# Patient Record
Sex: Female | Born: 1950 | Race: Black or African American | Hispanic: No | Smoking: Never smoker
Health system: Southern US, Community
[De-identification: ages and names within clinical notes are randomized; demographics above are authoritative.]

## PROBLEM LIST (undated history)

## (undated) ENCOUNTER — Emergency Department (HOSPITAL_COMMUNITY): Payer: Medicare PPO | Source: Home / Self Care

## (undated) HISTORY — PX: ABDOMINAL HYSTERECTOMY: SHX81

## (undated) HISTORY — PX: BREAST BIOPSY: SHX20

---

## 1998-12-19 ENCOUNTER — Ambulatory Visit (HOSPITAL_COMMUNITY): Admission: RE | Admit: 1998-12-19 | Discharge: 1998-12-19 | Payer: Self-pay | Admitting: *Deleted

## 1999-05-15 ENCOUNTER — Encounter: Admission: RE | Admit: 1999-05-15 | Discharge: 1999-08-13 | Payer: Self-pay | Admitting: *Deleted

## 2000-10-07 ENCOUNTER — Ambulatory Visit (HOSPITAL_COMMUNITY): Admission: RE | Admit: 2000-10-07 | Discharge: 2000-10-07 | Payer: Self-pay | Admitting: Gastroenterology

## 2000-10-07 ENCOUNTER — Encounter (INDEPENDENT_AMBULATORY_CARE_PROVIDER_SITE_OTHER): Payer: Self-pay | Admitting: Specialist

## 2002-11-09 ENCOUNTER — Other Ambulatory Visit: Admission: RE | Admit: 2002-11-09 | Discharge: 2002-11-09 | Payer: Self-pay | Admitting: Family Medicine

## 2002-11-22 ENCOUNTER — Ambulatory Visit (HOSPITAL_COMMUNITY): Admission: RE | Admit: 2002-11-22 | Discharge: 2002-11-22 | Payer: Self-pay | Admitting: Family Medicine

## 2002-11-22 ENCOUNTER — Encounter: Payer: Self-pay | Admitting: Family Medicine

## 2002-12-29 ENCOUNTER — Ambulatory Visit (HOSPITAL_COMMUNITY): Admission: RE | Admit: 2002-12-29 | Discharge: 2002-12-29 | Payer: Self-pay | Admitting: Urology

## 2002-12-29 ENCOUNTER — Encounter: Payer: Self-pay | Admitting: Obstetrics and Gynecology

## 2005-03-29 ENCOUNTER — Other Ambulatory Visit: Admission: RE | Admit: 2005-03-29 | Discharge: 2005-03-29 | Payer: Self-pay | Admitting: Family Medicine

## 2006-02-05 ENCOUNTER — Encounter (INDEPENDENT_AMBULATORY_CARE_PROVIDER_SITE_OTHER): Payer: Self-pay | Admitting: Specialist

## 2006-02-05 ENCOUNTER — Ambulatory Visit (HOSPITAL_COMMUNITY): Admission: RE | Admit: 2006-02-05 | Discharge: 2006-02-06 | Payer: Self-pay | Admitting: Obstetrics and Gynecology

## 2007-04-13 ENCOUNTER — Encounter: Admission: RE | Admit: 2007-04-13 | Discharge: 2007-04-13 | Payer: Self-pay | Admitting: Family Medicine

## 2008-06-20 ENCOUNTER — Encounter: Admission: RE | Admit: 2008-06-20 | Discharge: 2008-06-20 | Payer: Self-pay | Admitting: Internal Medicine

## 2010-08-24 NOTE — Procedures (Signed)
[facility]  Patient:    [name], [name]                     MRN: [id number] Proc. Date: 10/07/00 Adm. Date:  [id number] Attending:  Orland Mustard CC:         Talmadge Coventry, M.D.                           Procedure Report  PROCEDURE PERFORMED:  Esophagogastroduodenoscopy and biopsy.  ENDOSCOPIST:  Llana Aliment. Edwards, M.D.  MEDICATIONS:  Hurricaine spray, fentanyl 75 mcg, and Versed 7.5 mg IV.  INDICATIONS:  Persistent iron deficiency anemia.  DESCRIPTION OF PROCEDURE:  The procedure has been explained to the patient and consent obtained.  With the patient in the left lateral decubitus position, the Olympus video endoscope was inserted blindly into the esophagus and advanced under direct visualization.  The duodenum was completely normal endoscopically.  We advanced way down into the second and third portion of the duodenum, and obtained several biopsies to rule out celiac disease.  The scope was withdrawn back into the stomach.  There was moderate antral gastritis with several erosions.  A biopsy was taken for rapid urease test for Helicobacter. The fundus and cardia were seen well and the retroview was normal.  The distal and proximal esophagus were endoscopically normal.  The scope was withdrawn and the patient tolerated the procedure.  She was maintained on low flow oxygen and pulse oximeter throughout the procedure with no obvious problem.  ASSESSMENT: 1. Antral gastritis. 2. No other obvious cause for the patients iron deficiency anemia.  PLAN: 1. We will go ahead and check test for Helicobacter and    celiac disease. 2. Begin Aciphex. 3. Will see her back in the office in two months. DD:  10/07/00 TD:  10/07/00 Job: [id number]

## 2010-08-24 NOTE — Op Note (Signed)
[name], [name]              ACCOUNT NO.:  [id number]   MEDICAL RECORD NO.:  [id number]          PATIENT TYPE:  AMB   LOCATION:  SDC                           FACILITY:  [facility]   PHYSICIAN:  Richardean Sale, M.D.   DATE OF BIRTH:  March 08, 1951   DATE OF PROCEDURE:  02/05/2006  DATE OF DISCHARGE:                                 OPERATIVE REPORT   PREOPERATIVE DIAGNOSIS:  Symptomatic uterine fibroids.   POSTOPERATIVE DIAGNOSIS:  Symptomatic uterine fibroids.   PROCEDURE:  Laparoscopic assisted vaginal hysterectomy with bilateral  salpingo-oophorectomy.   SURGEON:  Richardean Sale, M.D.   ASSISTANT:  Cordelia Pen A. Rosalio Macadamia, M.D.   ANESTHESIA:  General.   COMPLICATIONS:  None.   ESTIMATED BLOOD LOSS:  200 mL.   FINDINGS:  14 weeks' size fibroid uterus with multiple intramural fibroid,  normal appearing tubes and ovaries bilaterally, normal appearing cervix.   SPECIMEN:  Uterus, cervix, tubes and ovaries sent to pathology.   INDICATIONS:  This is a 60 year old African American female gravida 2, para  2, who presents today for definitive management of symptomatic uterine  fibroids.  The patient has experienced increasing lower abdominal pressure  and pain secondary to her fibroids.  Ultrasound showed multiple uterine  fibroids and an approximately 14- weeks size uterus.  The patient presents  today for definitive management with hysterectomy. Prior to procedure we  discussed the risks, benefits and alternatives of surgery and informed was  obtained.  We discussed the risks which include but are not limited to  hemorrhage requiring transfusion, infection, injury to the bowel, bladder,  ureters or other organs which could require additional surgery, we reviewed  risks of DVT, pulmonary embolus, anesthesia related complications.  The  patient voiced understanding of all the above and desires to proceed.  Informed consent was obtained and all questions were answered before  proceeding  to the OR.  The plan is to attempt a laparoscopic hysterectomy,  possible total laparoscopic hysterectomy, or an LAVH if feasible based on  findings at the time of laparoscopy.   DESCRIPTION OF PROCEDURE:  The patient was taken to the operating room where  she was given a general anesthetic.  She was then prepped and draped in the  usual sterile fashion with Betadine and a Foley catheter was placed.  A  speculum was then placed in the vagina and the Rumi uterine manipulator was  placed after the uterus was sounded to 9 cm.  Once this was secure, the  speculum was removed and attention was then turned to the patient's abdomen.  4 mL of 0.25% plain Marcaine were injected in the infraumbilical area and a  10 mm skin incision was made through the patient's prior tubal ligation  scar.  This was carried down sharply to the fascia.  The fascia was then  grasped using two Kocher clamps and the fascia was then entered sharply.  The fascial incision was then secured with a pursestring Vicryl suture and  the peritoneum was then identified and a finger was placed into the  incision, there were no obvious adhesions.  The [facility] trocar  and sleeve  were then introduced and intra-abdominal placement was confirmed with the  laparoscope.  CO2 gas was allowed to insufflate. Two additional ports were  then made in both lower quadrants, these were 5-mm ports.  Again, by first  injecting Marcaine and then making an incision.  Each port was introduced  under direct visualization without complication.   Inspection of the pelvis revealed findings as noted.  Both adnexa, ovaries  and tubes appeared normal. Liver and gallbladder normal.  Appendix appeared  normal. Anterior and posterior cul-de-sacs were clear. The uterus was  approximately 12-14 weeks size with multiple uterine fibroids.  There was an  approximately 3 cm fibroid on the left lower uterine segment. Given that the  uterus was mobile and there was  adequate lateral mobility and both cul-de-  sacs were clear, the decision was made to proceed with laparoscopic  approach. Both ureters were visualized and peristalsing normally.  They were  well beneath the infundibulopelvic ligaments.  At this point, the round  ligaments on both sides were grasped with the gyrus, cauterized, transected,  and were hemostatic.  The right infundibulopelvic ligament was grasped,  cauterized, transected, and was hemostatic, as well.  The broad ligament was  then dissected and the anterior and posterior leaves were grasped with the  gyrus, cauterized, and transected.  The bladder flap was created using the  endoshears carrying the anterior broad ligament incision across to the mid  portion of the lower uterine segment.  Attention was then turned to the left  side. There were adhesions involving the colon to the sidewall and the  infundibulopelvic ligament.  Therefore, the decision was made to proceed  with ligation of the utero-ovarian ligaments and after completion of the  hysterectomy, to return and remove the left ovary and tube as visualization  and access would be much better.  The utero-ovarian ligament was cauterized  with the gyrus, was transected, and was hemostatic.  The broad ligament was  then separated from the anterior and posterior leaves and was serial  cauterized and transected, as well.  The endoshears were then used to  complete formation of the bladder flap.  The bladder was then very gently  dissected off the lower uterine segment and the cervix without difficulty.  On the left lower uterine segment, there was a fibroid was present that made  it difficult to visualize the uterine vessels and I did not believe I could  safely ligate those laparoscopically.  Therefore, given that the bladder was  adequately displaced, the decision was made to convert to a laparoscopic- assisted vaginal hysterectomy.   At this point, attention was then turned  to the vaginal portion of the case  where the [facility] manipulator was removed.  A posterior weighted speculum was  then placed in the vagina and the cervix was grasped with two Christella Hartigan  tenaculum.  20 mL of dilute vasopressin were then injected around the cervix  and a circumferential incision was made with the Bovie around the cervix.  The bladder was then carefully dissected off the cervix and the anterior cul-  de-sac was then entered with Metzenbaum scissors. The posterior cul-de-sac  was entered without difficulty.  The uterosacral ligaments on both sides  were then clamped, transected and suture ligated with Vicryl suture and were  hemostatic. At this point, the LigaSure was then used to secure the  remaining portions of the cardinal ligaments. The uterine vessels on the  left side were able to be  identified.  These were cauterized with a LigaSure  and were transected with good hemostasis.  The same procedure was then  performed on the right side. Any additional attachments were cauterized with  the LigaSure and then transected. At this point, a finger was then placed  into the vaginal incision and the uterus was then freed from all  attachments.  Given the size of the uterus, it was unable to be removed en  toto, therefore, the uterus was bivalved and multiple fibroids were then  cored out in order to the decrease the size of the uterus and once this  occurred, we were able to remove the uterus.  All specimens were then sent  together to pathology.  Estimated weight was 320 grams in the OR.  The right  ovary and tube were removed with the uterine specimen.   At this point, the vaginal cuff was carefully inspected.  Any areas of  bleeding were secured with figure-of-eight Vicryl sutures and the anterior  and posterior vaginal cuff and peritoneum were made hemostatic with a  running locked Vicryl suture. A sponge stick was used to keep the bowel out  of the incision. Once the vaginal cuff  was hemostatic, the vagina was then  closed using interrupted figure-of-eight suture.  A McCall's culdoplasty  suture was placed to prevent enterocele formation and the uterosacral  ligaments were then plicated together in the midline for cuff support. Once  the cuff was hemostatic, the vagina was then packed with plain gauze soaked  in Estrace cream. The Foley catheter was left in place.   Sterile gown and gloves were then changed and attention was then turned back  to the abdomen.  The three ports had remained in place, the CO2 gas was  allowed to insufflate, and the camera was then reinserted in the umbilical  port.  The vaginal cuff was inspected.  There was some small oozing coming  from the peritoneum.  This was cauterized and hemostatic.  The Nezhat was  used to irrigate the pelvis.  The left infundibular pelvic ligament was identified.  There were some very thin adhesions of the colon attached to  the infundibulopelvic ligament and in the sidewall.  These were very  carefully transected with the endoshears and were hemostatic. At this point,  the infundibulopelvic ligament was then able to be safely grasped with the  gyrus, cauterized, transected, and was hemostatic.  The left tube and ovary  were then detached.  A grasper then was then placed into the operative  laparoscopic and the ovary and tube were removed through the 10 mm umbilical  port and sent to pathology labeled as left ovary and tube. At this point,  the camera was reintroduced and all pedicles and the vaginal cuff were  carefully inspected.  There was no bleeding noted.  The Nezhat was used to  irrigate and suction.  Once hemostasis was confirmed, the procedure was then  terminated. All instruments were removed from the patient's abdomen.  There  was no bleeding coming from the port sites.  The CO2 gas was allowed to  escape. The 10 mm fascial incision was closed by tying the already placed  pursestring suture with  successful closure of the fascia.  The umbilical  incision was closed with a running 4-0 Vicryl suture in a subcuticular  fashion and the 5 mm port were closed with Dermabond.   The patient tolerated the procedure very well.  All sponge, lap, needle and  instrument counts were correct x2.  The patient was taken to the recovery  room awake and in stable condition and there were no complications.      Richardean Sale, M.D.  Electronically Signed     JW/MEDQ  D:  02/05/2006  T:  02/05/2006  Job:  [id number]

## 2010-08-24 NOTE — Discharge Summary (Signed)
[name], [name]              ACCOUNT NO.:  [id number]   MEDICAL RECORD NO.:  [id number]          PATIENT TYPE:  INP   LOCATION:  9308                          FACILITY:  [facility]   PHYSICIAN:  Richardean Sale, M.D.   DATE OF BIRTH:  Jan 24, 1951   DATE OF ADMISSION:  02/05/2006  DATE OF DISCHARGE:  02/06/2006                               DISCHARGE SUMMARY   ADMITTING DIAGNOSIS:  Symptomatic uterine fibroids.   DISCHARGE DIAGNOSES:  1. Symptomatic uterine fibroids.  2. Status post laparoscopic-assisted vaginal hysterectomy with      bilateral salpingo-oophorectomy.   OPERATIVE FINDINGS:  A 14-week size fibroid uterus with multiple  intramural fibroids, normal-appearing fallopian tubes and ovaries and  cervix.   HOSPITAL COURSE/HISTORY OF PRESENT ILLNESS:  Please see admission  History and Physical for details. Briefly, this is a 60 year old African-  American female, gravida 2, para 2, who presented for definitive  management of uterine fibroids. She underwent a laparoscopic-assisted  vaginal hysterectomy with bilateral salpingo-oophorectomy on 02/05/2006.  Medical history is significant for diabetes. The patient's blood sugars  were controlled with a Glucomander during her hospitalization until she  was able to tolerate a regular diet and restart her home medications.  Postoperatively, she remained afebrile. On postop day #1, she was  ambulating well, was voiding without difficulty, and was tolerating a  regular diet and pain was controlled with oral pain medication. She was  subsequently discharged to home on postop day #1 in improved condition.   LABORATORY STUDIES:  Postop day #1, hemoglobin 11, hematocrit 32.8,  platelets 228,000, white count 8.9. Pathology: Benign-appearing cervix,  uterus, ovaries, and fallopian tubes, small paratubal cyst, benign  endometrial polyp and multiple leiomyoma.   DISPOSITION:  To home.   CONDITION:  Improved.   FOLLOWUP:  1. The patient  will follow up in 4 weeks for routine postop visit.  2. She will also follow up with Dr. Smith Mince, her primary care      physician, within the next 2 weeks.   MEDICATIONS:  1. Percocet 1-2 tablets p.o. q.4-6 h. p.r.n. pain.  2. Motrin 600 mg p.o. q.6 h. p.r.n. pain.  3. The patient is to resume her home medications.   INSTRUCTIONS:  She is to call for fever greater than 100.5, heavy  vaginal bleeding, or pain not relieved with pain medication, or any  other questions.      Richardean Sale, M.D.  Electronically Signed     JW/MEDQ  D:  02/25/2006  T:  02/25/2006  Job:  [id number]

## 2014-07-06 ENCOUNTER — Encounter: Payer: Self-pay | Admitting: Dietician

## 2014-07-06 ENCOUNTER — Encounter: Payer: Medicare PPO | Attending: Internal Medicine | Admitting: Dietician

## 2014-07-06 VITALS — Ht 68.0 in | Wt 231.6 lb

## 2014-07-06 DIAGNOSIS — Z713 Dietary counseling and surveillance: Secondary | ICD-10-CM | POA: Insufficient documentation

## 2014-07-06 DIAGNOSIS — Z794 Long term (current) use of insulin: Secondary | ICD-10-CM | POA: Diagnosis not present

## 2014-07-06 DIAGNOSIS — E119 Type 2 diabetes mellitus without complications: Secondary | ICD-10-CM | POA: Diagnosis not present

## 2014-07-06 NOTE — Progress Notes (Signed)
  Medical Nutrition Therapy:  Appt start time: 1515 end time:  1615.   Assessment:  Primary concerns today: [name] is here today since she would like help with a food plan for diabetes and would like to lose some weight (around 50 lbs). Has had diabetes for about 30 years and has seen a dietitian abotu 15 years ago. Tests blood sugar in the morning and 114-166 mg/dl. Recently was taking Metformin and Lantus and is now taking Toujeo and Kombliglyze about a month ago. Numbers went up in the past month but they are coming back down. Has been drinking regular Coke lately. Most recent Hgb A1c was 10.1% on 06/08/2014.  Had some blood sugar readings over 200 mg/dl earlier in the month but it is coming back down. Has not had any recent low blood sugars.  Has trouble chewing some foods because she is missing some teeth.  [name] is working part time 10-2 every day and lives by herself, though his son and family might be moving in. Might skip lunch.   Preferred Learning Style:   No preference indicated   Learning Readiness:   Ready  MEDICATIONS: see list   DIETARY INTAKE:  Usual eating pattern includes 2-3 meals and 1-2 snacks per day.  Avoided foods include: steak, foods that are hard to chew, asparagus   24-hr recall:  B ( AM): coffee with splenda and cream with egg and sausage McMuffin Snk ( AM): applesauce L ( PM): crackers with peanut butter Snk ( PM): none D ( PM): picks up burger king chicken sandwich or whopper Or taco bell taco salad or [employer] Snk ( PM): peanut butter sandwiches, frosted mini wheats/cereal, pecans Beverages: 1 small Coke, juice, lactaid milk, water  Usual physical activity: no regular activity   Estimated energy needs: 1600 calories 180 g carbohydrates 120 g protein 44 g fat  Progress Towards Goal(s):  In progress.   Nutritional Diagnosis:  [place]-2.1 Inpaired nutrition utilization As related to glucose metabolism.  As evidenced by Hgb A1c of 10.1%.     Intervention:  Nutrition counseling provided. Goals:  Follow Diabetes Meal Plan as instructed  Eat 3 meals and 2 snacks, every 3-5 hrs  Limit carbohydrate intake to 30-45 grams carbohydrate/meal  Limit carbohydrate intake to 0-15 grams carbohydrate/snack  Add lean protein foods to meals/snacks  Monitor glucose levels as instructed by your doctor  Aim for 30 mins of physical activity daily. Look into joining gym or walking with a goal 30 minutes 3 x week.   Try using plain non fat [place] Yogurt instead of sour cream  Plan to prepare dinner 2 x week  For dinner meals: have salad with tuna, make a tacos salad with refried beans, or rotisserie chicken, or spinach with a boiled egg  Think about add some vegetables on the side to fast food meals at home  Teaching Method Utilized:  Visual Auditory Hands on  Handouts given during visit include:  Living Well With Diabetes  Meal card  Blood Glucose monitoring  15 g CHO Snacks  Diabetes Medication  Barriers to learning/adherence to lifestyle change: none  Demonstrated degree of understanding via:  Teach Back   Monitoring/Evaluation:  Dietary intake, exercise, and body weight in 2 month(s).

## 2014-07-06 NOTE — Patient Instructions (Addendum)
Goals:  Follow Diabetes Meal Plan as instructed  Eat 3 meals and 2 snacks, every 3-5 hrs  Limit carbohydrate intake to 30-45 grams carbohydrate/meal  Limit carbohydrate intake to 0-15 grams carbohydrate/snack  Add lean protein foods to meals/snacks  Monitor glucose levels as instructed by your doctor  Aim for 30 mins of physical activity daily. Look into joining gym or walking with a goal 30 minutes 3 x week.   Try using plain non fat [place] Yogurt instead of sour cream  Plan to prepare dinner 2 x week  For dinner meals: have salad with tuna, make a tacos salad with refried beans, or rotisserie chicken, or spinach with a boiled egg  Think about add some vegetables on the side to fast food meals at home

## 2014-09-07 ENCOUNTER — Ambulatory Visit: Payer: Medicare PPO | Admitting: Dietician

## 2015-03-30 ENCOUNTER — Other Ambulatory Visit: Payer: Self-pay | Admitting: Internal Medicine

## 2015-03-30 DIAGNOSIS — Z1231 Encounter for screening mammogram for malignant neoplasm of breast: Secondary | ICD-10-CM

## 2015-03-30 DIAGNOSIS — E2839 Other primary ovarian failure: Secondary | ICD-10-CM

## 2015-05-04 ENCOUNTER — Ambulatory Visit
Admission: RE | Admit: 2015-05-04 | Discharge: 2015-05-04 | Disposition: A | Discharge status: Home / Self Care | Payer: Medicare PPO | Source: Ambulatory Visit | Attending: Internal Medicine | Admitting: Internal Medicine

## 2015-05-04 DIAGNOSIS — E2839 Other primary ovarian failure: Secondary | ICD-10-CM

## 2015-05-04 DIAGNOSIS — Z1231 Encounter for screening mammogram for malignant neoplasm of breast: Secondary | ICD-10-CM

## 2016-08-20 ENCOUNTER — Ambulatory Visit (INDEPENDENT_AMBULATORY_CARE_PROVIDER_SITE_OTHER): Payer: Medicare Other

## 2016-08-20 ENCOUNTER — Ambulatory Visit (HOSPITAL_COMMUNITY)
Admission: EM | Admit: 2016-08-20 | Discharge: 2016-08-20 | Disposition: A | Discharge status: Home / Self Care | Payer: Medicare Other | Attending: Internal Medicine | Admitting: Internal Medicine

## 2016-08-20 ENCOUNTER — Encounter (HOSPITAL_COMMUNITY): Payer: Self-pay | Admitting: Family Medicine

## 2016-08-20 DIAGNOSIS — M25562 Pain in left knee: Secondary | ICD-10-CM | POA: Diagnosis not present

## 2016-08-20 DIAGNOSIS — W19XXXA Unspecified fall, initial encounter: Secondary | ICD-10-CM

## 2016-08-20 DIAGNOSIS — S80212A Abrasion, left knee, initial encounter: Secondary | ICD-10-CM | POA: Diagnosis not present

## 2016-08-20 NOTE — ED Triage Notes (Signed)
Pt here for left knee pain after a fall. sts she tripped and fell over something taped in the floor,

## 2016-08-20 NOTE — Discharge Instructions (Addendum)
Your xrays were negative for fracture, effusion, or dislocation. Ace wrap for comfort, rest,ice,elevate. Follow up with Orthopedics, Aundria Rudogers, in 1 week if symptoms not improving. Return to UC as needed. May take 400mg   of ibuprofen every 8 hours for 3 days for pain/inflammation. Daily wound care.

## 2016-08-20 NOTE — ED Provider Notes (Signed)
CSN: [id number]     Arrival date & time 08/20/16  1338 History   None    Chief Complaint  Patient presents with  . Knee Pain   (Consider location/radiation/quality/duration/timing/severity/associated sxs/prior Treatment) The history is provided by the patient. No language interpreter was used.  Knee Pain  Location:  Knee Injury: yes   Mechanism of injury: fall   Mechanism of injury comment:  Over tapped [place] on [place], no LOC, did not strike head Fall:    Fall occurred:  Tripped and walking   Height of fall:  Stance   Impact surface:  Primary school teacher of impact:  Knees   Entrapped after fall: no   Knee location:  L knee Pain details:    Quality:  Aching   Radiates to:  Does not radiate   Severity:  Mild   Onset quality:  Sudden   Timing:  Constant   Progression:  Improving Chronicity:  New Dislocation: no   Foreign body present:  No foreign bodies Tetanus status:  Up to date Prior injury to area:  No Relieved by:  Rest and NSAIDs Worsened by:  Nothing Associated symptoms: swelling   Associated symptoms: no back pain, no decreased ROM and no fever     History reviewed. No pertinent past medical history. Past Surgical History:  Procedure Laterality Date  . ABDOMINAL HYSTERECTOMY     History reviewed. No pertinent family history. Social History  Substance Use Topics  . Smoking status: Never Smoker  . Smokeless tobacco: Never Used  . Alcohol use Not on file   OB History    No data available     Review of Systems  Constitutional: Negative for activity change and fever.  HENT: Negative.   Eyes: Negative.   Respiratory: Negative for shortness of breath.   Cardiovascular: Negative for chest pain.  Gastrointestinal: Negative for abdominal pain.  Endocrine: Negative.   Genitourinary: Negative.   Musculoskeletal: Positive for joint swelling. Negative for back pain and gait problem.  Skin: Positive for wound. Negative for color change.  Neurological:  Negative for headaches.  Hematological: Negative.   Psychiatric/Behavioral: Negative.   All other systems reviewed and are negative.   Allergies  Patient has no known allergies.  Home Medications   Prior to Admission medications   Medication Sig Start Date End Date Taking? Authorizing Provider  aspirin 81 MG tablet Take 81 mg by mouth daily.    [provider]  Cholecalciferol (VITAMIN D-3 PO) Take by mouth.    [provider]  Coenzyme Q10 200 MG capsule Take 200 mg by mouth daily.    [provider]  hydrochlorothiazide (HYDRODIURIL) 25 MG tablet Take 25 mg by mouth daily.    [provider]  Insulin Glargine (TOUJEO SOLOSTAR [place]) Inject 44 Units into the skin.    [provider]  Multiple Vitamins-Minerals (MULTIVITAMIN WITH MINERALS) tablet Take 1 tablet by mouth daily.    [provider]  niacin 50 MG tablet Take 50 mg by mouth at bedtime.    [provider]  ramipril (ALTACE) 5 MG capsule Take 5 mg by mouth daily.    [provider]  Saxagliptin-Metformin (KOMBIGLYZE XR PO) Take by mouth.    [provider]  simvastatin (ZOCOR) 40 MG tablet Take 40 mg by mouth daily.    [provider]   Meds Ordered and Administered this Visit  Medications - No data to display  BP (!) 138/58   Pulse  89   Temp 98 F (36.7 C)   Resp 16   SpO2 100%  No data found.   Physical Exam  Constitutional: She is oriented to person, place, and time. She appears well-developed and well-nourished. She is active.  Non-toxic appearance. She does not have a sickly appearance. She does not appear ill. No distress.  HENT:  Head: Normocephalic.  Right Ear: Tympanic membrane normal.  Left Ear: Tympanic membrane normal.  Nose: Nose normal.  Mouth/Throat: Uvula is midline and mucous membranes are normal.  Eyes: Pupils are equal, round, and reactive to light.  Neck: Trachea normal and normal range of motion. Muscular  tenderness present. No Brudzinski's sign and no Kernig's sign noted.  Cardiovascular: Normal rate and regular rhythm.   Pulmonary/Chest: Effort normal and breath sounds normal.  Musculoskeletal:       Right shoulder: She exhibits normal range of motion, no tenderness, no bony tenderness, no swelling, no effusion, no crepitus, no deformity, no laceration, normal pulse and normal strength.  Neurological: She is alert and oriented to person, place, and time. She has normal strength. No cranial nerve deficit or sensory deficit. Gait normal. GCS eye subscore is 4. GCS verbal subscore is 5. GCS motor subscore is 6.  Skin: Skin is warm and dry. Abrasion noted. No rash noted.     Psychiatric: She has a normal mood and affect. Her speech is normal and behavior is normal.  Nursing note and vitals reviewed.   Urgent Care Course     Procedures (including critical care time)  Labs Review Labs Reviewed - No data to display  Imaging Review Dg Knee Complete 4 Views Left  Result Date: 08/20/2016 CLINICAL DATA:  Fall several days ago with knee pain, initial encounter EXAM: LEFT KNEE - COMPLETE 4+ VIEW COMPARISON:  None. FINDINGS: Mild medial joint space narrowing is noted. No sizable joint effusion is seen. No acute fracture or dislocation is noted. IMPRESSION: Mild degenerative change without acute abnormality. Electronically Signed   By: Alcide CleverMark  Lukens M.D.   On: 08/20/2016 14:53       MDM   1. Acute pain of left knee   2. Fall, initial encounter   3. Abrasion of left knee, initial encounter     1605: Your xrays were negative for fracture, effusion, or dislocation. Ace wrap applied in UC  for comfort, rest,ice,elevate. Follow up with Orthopedics, Aundria Rudogers, in 1 week if symptoms not improving. Return to UC as needed. May take 400mg   of ibuprofen every 8 hours for 3 days for pain/inflammation. Daily wound care. Pt verbalized understanding to this provider.     Clancy Gourdefelice, Dovber Ernest, NP 08/20/16  [phone]

## 2016-12-28 ENCOUNTER — Emergency Department (HOSPITAL_COMMUNITY)
Admission: EM | Admit: 2016-12-28 | Discharge: 2016-12-28 | Disposition: A | Discharge status: Home / Self Care | Payer: Medicare Other | Attending: Emergency Medicine | Admitting: Emergency Medicine

## 2016-12-28 DIAGNOSIS — Z79899 Other long term (current) drug therapy: Secondary | ICD-10-CM | POA: Insufficient documentation

## 2016-12-28 DIAGNOSIS — R55 Syncope and collapse: Secondary | ICD-10-CM | POA: Diagnosis present

## 2016-12-28 DIAGNOSIS — Z7982 Long term (current) use of aspirin: Secondary | ICD-10-CM | POA: Insufficient documentation

## 2016-12-28 LAB — CBC
HCT: 35.7 % — ABNORMAL LOW (ref 36.0–46.0)
HEMOGLOBIN: 11.2 g/dL — AB (ref 12.0–15.0)
MCH: 26.8 pg (ref 26.0–34.0)
MCHC: 31.4 g/dL (ref 30.0–36.0)
MCV: 85.4 fL (ref 78.0–100.0)
Platelets: 269 10*3/uL (ref 150–400)
RBC: 4.18 MIL/uL (ref 3.87–5.11)
RDW: 13.5 % (ref 11.5–15.5)
WBC: 8.5 10*3/uL (ref 4.0–10.5)

## 2016-12-28 LAB — BASIC METABOLIC PANEL
Anion gap: 10 (ref 5–15)
BUN: 17 mg/dL (ref 6–20)
CALCIUM: 9.7 mg/dL (ref 8.9–10.3)
CHLORIDE: 103 mmol/L (ref 101–111)
CO2: 24 mmol/L (ref 22–32)
CREATININE: 1.56 mg/dL — AB (ref 0.44–1.00)
GFR calc non Af Amer: 33 mL/min — ABNORMAL LOW (ref >60)
GFR, EST AFRICAN AMERICAN: 39 mL/min — AB (ref >60)
Glucose, Bld: 301 mg/dL — ABNORMAL HIGH (ref 65–99)
Potassium: 5.1 mmol/L (ref 3.5–5.1)
SODIUM: 137 mmol/L (ref 135–145)

## 2016-12-28 MED ORDER — SODIUM CHLORIDE 0.9 % IV BOLUS (SEPSIS)
500.0000 mL | Freq: Once | INTRAVENOUS | Status: AC
  Administered 2016-12-28: 500 mL via INTRAVENOUS

## 2016-12-28 MED ORDER — SODIUM CHLORIDE 0.9 % IV SOLN: INTRAVENOUS | Status: DC

## 2016-12-28 NOTE — Discharge Instructions (Signed)
Follow-up with your doctor. Return for any new or worse symptoms. Return for any passing out. Today's workup without any significant findings.

## 2016-12-28 NOTE — ED Provider Notes (Signed)
[facility]-EMERGENCY DEPT Provider Note   CSN: [id number] Arrival date & time: 12/28/16  1858     History   Chief Complaint Chief Complaint  Patient presents with  . Near Syncope    HPI [name] is a 66 y.o. female.  The patient was walking into the view a college football game. And it was around 6 PM and she started to feel lightheaded and feels if she was going to pass out she sat down. She did not pass out. Similar symptoms happened about a week ago she thinks is due to the heat. She felt hot. Patient does not have a history of syncope. Has no complaints currently. Patient stated she started to feel lightheaded and dizzy. No chest pain no headache no shortness of breath no abdominal pain no nausea or vomiting. No focal weakness or speech problems.      No past medical history on file.  Patient Active Problem List   Diagnosis Date Noted  . Acute pain of left knee 08/20/2016  . Fall 08/20/2016    Past Surgical History:  Procedure Laterality Date  . ABDOMINAL HYSTERECTOMY      OB History    No data available       Home Medications    Prior to Admission medications   Medication Sig Start Date End Date Taking? Authorizing Provider  aspirin 81 MG tablet Take 81 mg by mouth daily.   Yes [provider]  Cholecalciferol (VITAMIN D-3 PO) Take by mouth.   Yes [provider]  Coenzyme Q10 200 MG capsule Take 200 mg by mouth daily.   Yes [provider]  hydrochlorothiazide (HYDRODIURIL) 25 MG tablet Take 25 mg by mouth daily.   Yes [provider]  Insulin Glargine (TOUJEO SOLOSTAR [place]) Inject 50 Units into the skin daily.    Yes [provider]  Multiple Vitamins-Minerals (MULTIVITAMIN WITH MINERALS) tablet Take 1 tablet by mouth daily.   Yes [provider]  niacin 50 MG tablet Take 50 mg by mouth at bedtime.   Yes [provider]  ramipril (ALTACE) 5 MG capsule Take 5 mg by mouth daily.   Yes [provider]  Saxagliptin-Metformin (KOMBIGLYZE XR) 2.08-998 MG TB24 Take 1 tablet by mouth 2 (two) times daily.    Yes [provider]  simvastatin (ZOCOR) 40 MG tablet Take 40 mg by mouth daily.   Yes [provider]    Family History No family history on file.  Social History Social History  Substance Use Topics  . Smoking status: Never Smoker  . Smokeless tobacco: Never Used  . Alcohol use Not on file     Allergies   Patient has no known allergies.   Review of Systems Review of Systems  Constitutional: Negative for fever.  HENT: Negative for congestion.   Eyes: Negative for visual disturbance.  Respiratory: Negative for shortness of breath.   Cardiovascular: Negative for chest pain.  Gastrointestinal: Negative for abdominal pain, nausea and vomiting.  Genitourinary: Negative for dysuria.  Musculoskeletal: Negative for back pain, myalgias and neck pain.  Skin: Negative for rash.  Neurological: Positive for dizziness, weakness and light-headedness. Negative for seizures, syncope, facial asymmetry, speech difficulty, numbness and headaches.  Hematological: Does not bruise/bleed easily.  Psychiatric/Behavioral: Negative for confusion.     Physical Exam Updated Vital Signs BP (!) 129/57   Pulse 94   Resp 15   SpO2 99%   Physical Exam  Constitutional: She is oriented to person,  place, and time. She appears well-developed and well-nourished. No distress.  HENT:  Head: Normocephalic and atraumatic.  Mouth/Throat: Oropharynx is clear and moist.  Eyes: Pupils are equal, round, and reactive to light. Conjunctivae and EOM are normal.  Neck: Normal range of motion. Neck supple.  Cardiovascular: Normal rate and regular rhythm.   Pulmonary/Chest: Effort normal and breath sounds normal. No respiratory distress.  Abdominal: Soft. Bowel sounds are normal. There is no tenderness.  Musculoskeletal: Normal range of motion. She exhibits no edema.    Neurological: She is alert and oriented to person, place, and time. No cranial nerve deficit or sensory deficit. She exhibits normal muscle tone. Coordination normal.  Skin: Skin is warm. Capillary refill takes less than 2 seconds. No rash noted.  Nursing note and vitals reviewed.    ED Treatments / Results  Labs (all labs ordered are listed, but only abnormal results are displayed) Labs Reviewed  CBC - Abnormal; Notable for the following:       Result Value   Hemoglobin 11.2 (*)    HCT 35.7 (*)    All other components within normal limits  BASIC METABOLIC PANEL - Abnormal; Notable for the following:    Glucose, Bld 301 (*)    Creatinine, Ser 1.56 (*)    GFR calc non Af Amer 33 (*)    GFR calc Af Amer 39 (*)    All other components within normal limits    EKG  EKG Interpretation  Date/Time:  Saturday December 28 2016 20:14:46 EDT Ventricular Rate:  91 PR Interval:    QRS Duration: 101 QT Interval:  358 QTC Calculation: 441 R Axis:   -175 Text Interpretation:  Sinus rhythm Right axis deviation No previous ECGs available Confirmed by Vanetta Mulders [phone]) on 12/28/2016 8:18:29 PM       Radiology No results found.  Procedures Procedures (including critical care time)  Medications Ordered in ED Medications  0.9 %  sodium chloride infusion (not administered)  sodium chloride 0.9 % bolus 500 mL (0 mLs Intravenous Stopped 12/28/16 2220)     Initial Impression / Assessment and Plan / ED Course  I have reviewed the triage vital signs and the nursing notes.  Pertinent labs & imaging results that were available during my care of the patient were reviewed by me and considered in my medical decision making (see chart for details).     Patient with near syncopal episode but did not pass out. Currently asymptomatic. Cardiac monitoring without arrhythmia. EKG without any acute findings. Basic labs without any significant abnormalities. No anemia. Patient remains  asymptomatic stable for discharge home. Recommended follow-up with her doctors. Patient will return for passing out. Or any new or worse symptoms.  Final Clinical Impressions(s) / ED Diagnoses   Final diagnoses:  Near syncope    New Prescriptions Discharge Medication List as of 12/28/2016 10:17 PM       Vanetta Mulders, MD 12/28/16 2255

## 2016-12-28 NOTE — ED Triage Notes (Signed)
Pt to ed via [facility] -- was outside at football game, extended period of time-- pt became near syncopal-- on EMS arrival at scene pt was hypertensive-- 180/100-- pt's BP returned to 120/80 once in airconditioning. States she did not drink enough fluid today. Alert/oriented x 4, w/d, color- pink/.

## 2020-06-07 ENCOUNTER — Other Ambulatory Visit: Payer: Self-pay | Admitting: Internal Medicine

## 2020-06-07 DIAGNOSIS — Z1231 Encounter for screening mammogram for malignant neoplasm of breast: Secondary | ICD-10-CM

## 2020-06-07 DIAGNOSIS — E2839 Other primary ovarian failure: Secondary | ICD-10-CM

## 2020-08-09 ENCOUNTER — Ambulatory Visit
Admission: RE | Admit: 2020-08-09 | Discharge: 2020-08-09 | Disposition: A | Discharge status: Home / Self Care | Payer: Medicare PPO | Source: Ambulatory Visit | Attending: Internal Medicine | Admitting: Internal Medicine

## 2020-08-09 ENCOUNTER — Other Ambulatory Visit: Payer: Self-pay

## 2020-08-09 DIAGNOSIS — Z1231 Encounter for screening mammogram for malignant neoplasm of breast: Secondary | ICD-10-CM

## 2020-11-23 ENCOUNTER — Other Ambulatory Visit: Payer: Medicare Other

## 2022-01-17 IMAGING — MG MM DIGITAL SCREENING BILAT W/ TOMO AND CAD
8 series · 8 of 24 positions shown · non-contrast
Comparison: Previous exam(s).

CLINICAL DATA: Screening.

EXAM:
DIGITAL SCREENING BILATERAL MAMMOGRAM WITH TOMOSYNTHESIS AND CAD
TECHNIQUE: Bilateral screening digital craniocaudal and mediolateral oblique
mammograms were obtained. Bilateral screening digital breast
tomosynthesis was performed. The images were evaluated with
computer-aided detection.

[L MLO synth-2D]
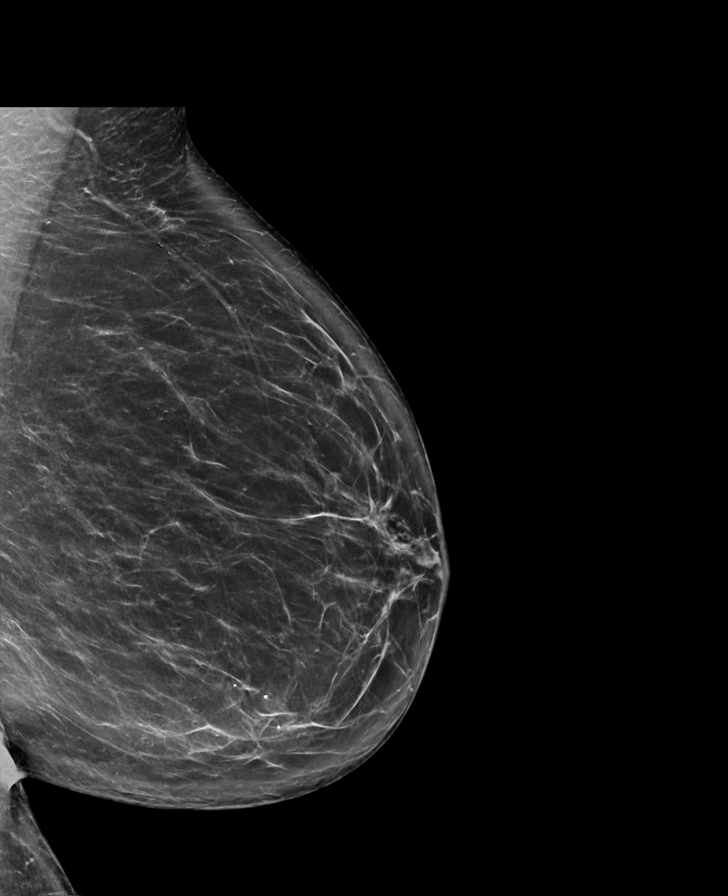

[R MLO synth-2D]
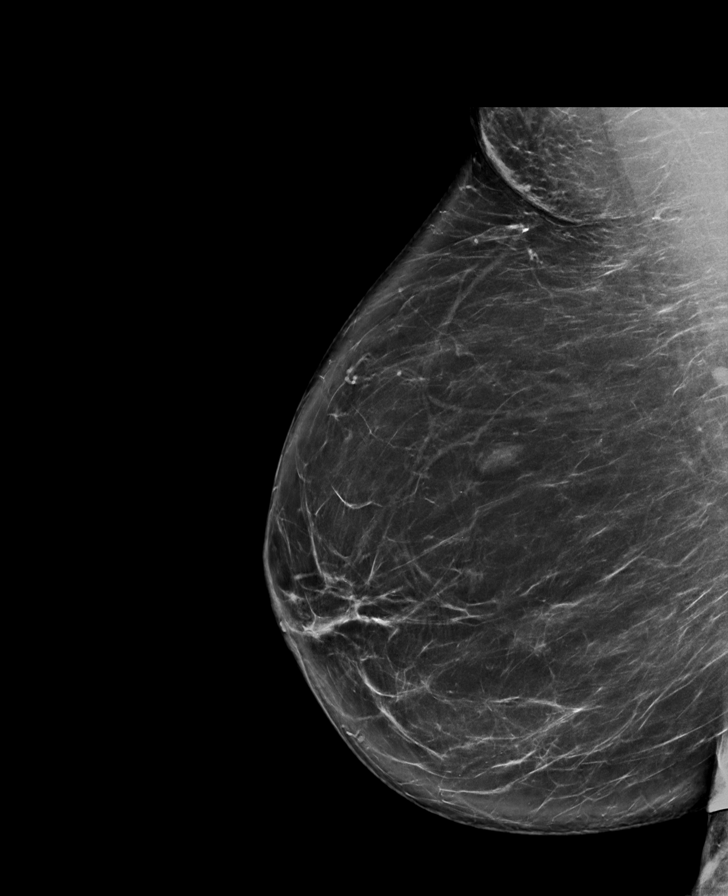

[L CC synth-2D]
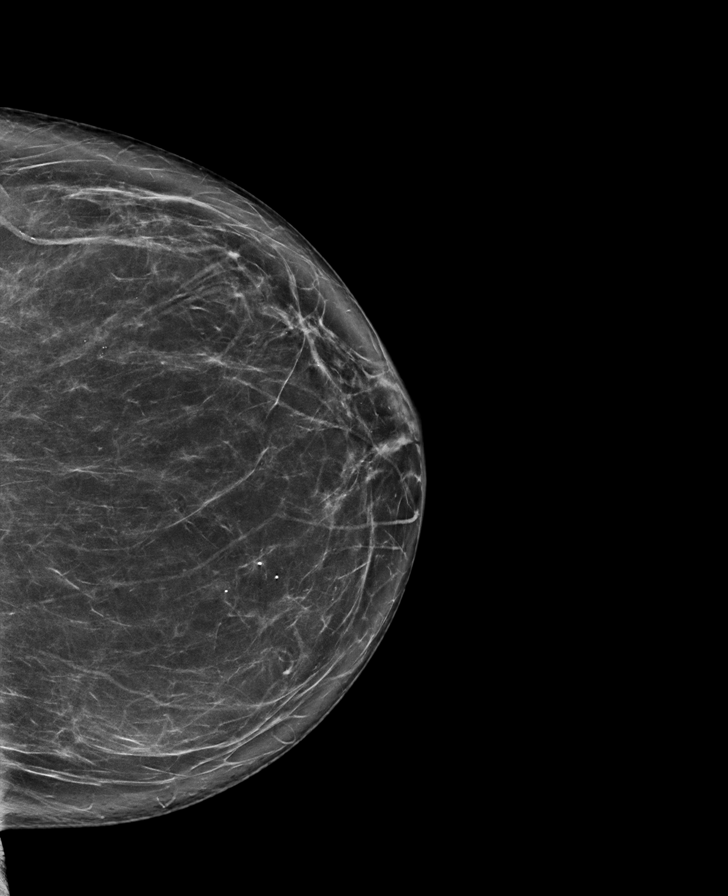

[R CC synth-2D]
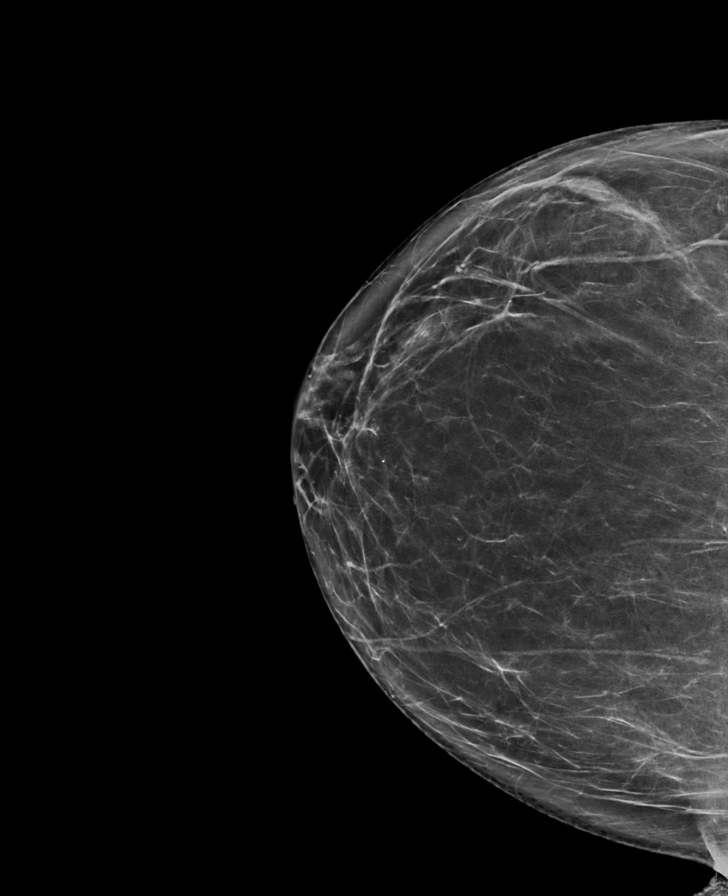

[R MLO tomo · tomo slice 47/94.0]
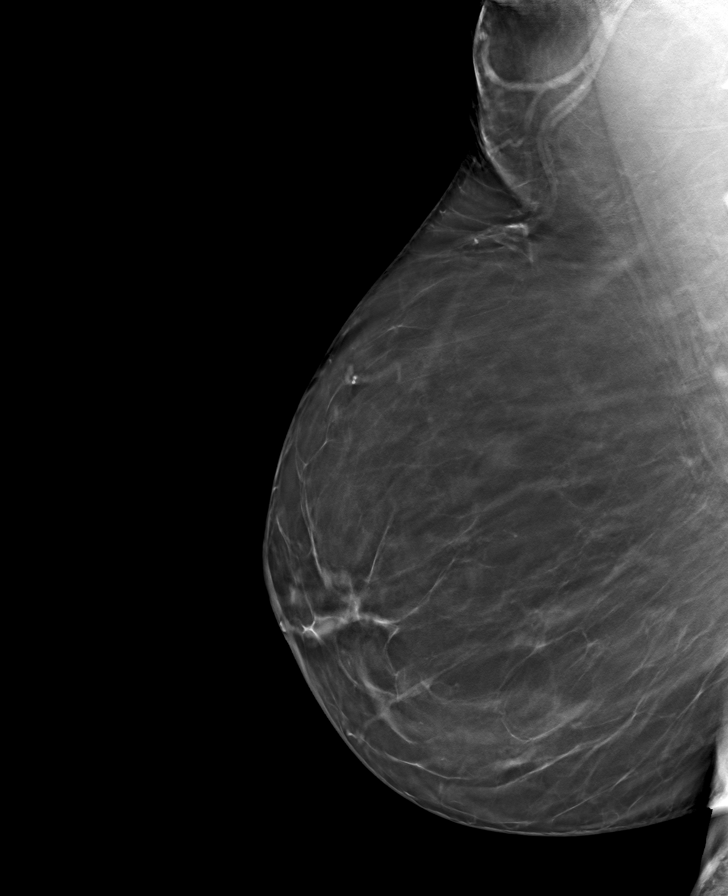

[L MLO tomo · tomo slice 46/91.0]
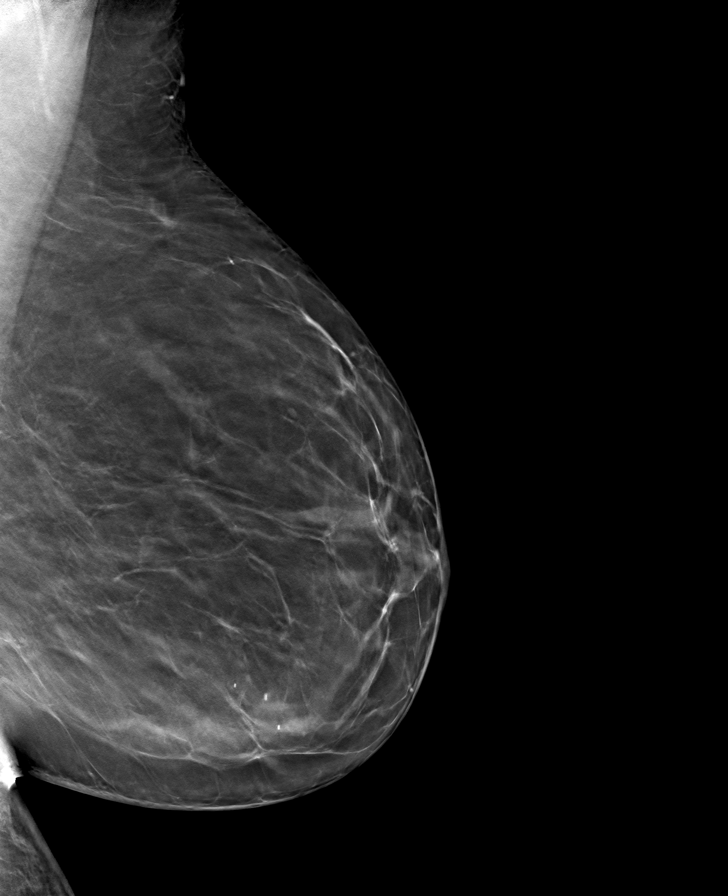

[R CC tomo · tomo slice 41/81.0]
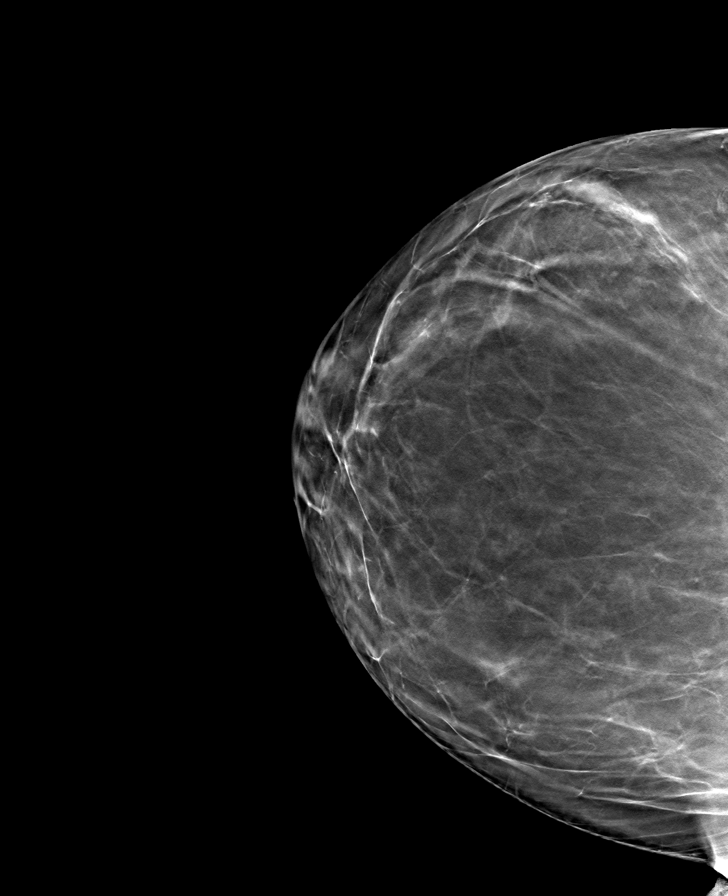

[L CC tomo · tomo slice 43/85.0]
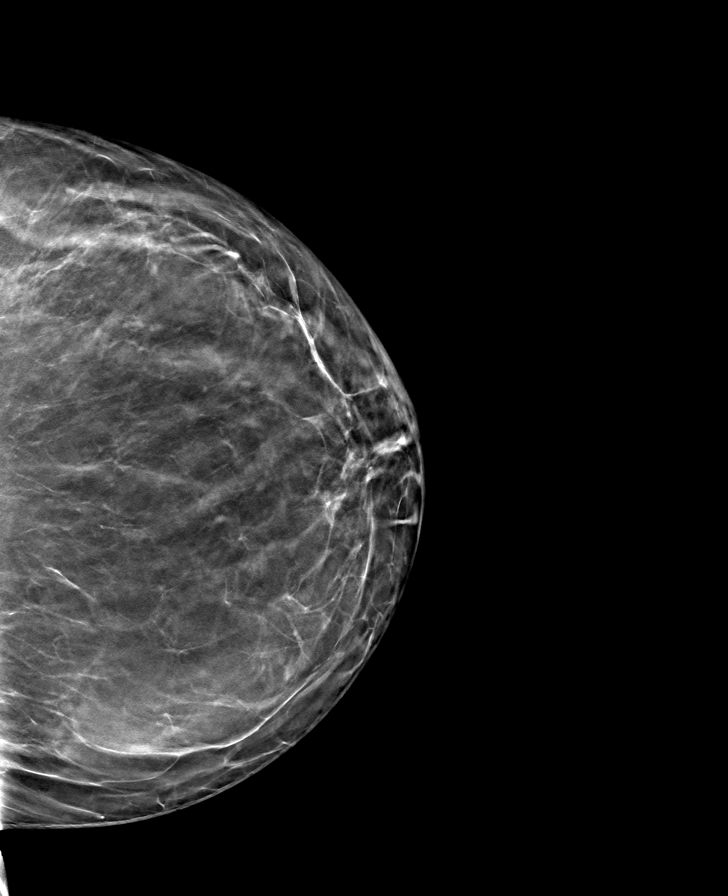

[8 of 24 positions shown; findings below may reference images not displayed]

ACR Breast Density Category b: There are scattered areas of
fibroglandular density.
FINDINGS: There are no findings suspicious for malignancy. The images were
evaluated with computer-aided detection.
IMPRESSION: No mammographic evidence of malignancy. A result letter of this
screening mammogram will be mailed directly to the patient.

RECOMMENDATION:
Screening mammogram in one year. (Code:WJ-I-BG6)

BI-RADS CATEGORY  1: Negative.

## 2022-11-20 ENCOUNTER — Other Ambulatory Visit: Payer: Self-pay | Admitting: Optometry

## 2022-11-20 DIAGNOSIS — H47213 Primary optic atrophy, bilateral: Secondary | ICD-10-CM

## 2022-11-25 ENCOUNTER — Ambulatory Visit (HOSPITAL_COMMUNITY): Admission: RE | Admit: 2022-11-25 | Payer: Medicare PPO | Source: Ambulatory Visit

## 2022-11-25 ENCOUNTER — Ambulatory Visit (HOSPITAL_COMMUNITY)
Admission: RE | Admit: 2022-11-25 | Discharge: 2022-11-25 | Disposition: A | Discharge status: Home / Self Care | Payer: Medicare PPO | Source: Ambulatory Visit | Attending: Optometry | Admitting: Optometry

## 2022-11-25 DIAGNOSIS — H47213 Primary optic atrophy, bilateral: Secondary | ICD-10-CM

## 2022-11-25 MED ORDER — GADOBUTROL 1 MMOL/ML IV SOLN
10.0000 mL | Freq: Once | INTRAVENOUS | Status: AC | PRN
  Administered 2022-11-25: 10 mL via INTRAVENOUS

## 2023-06-10 ENCOUNTER — Other Ambulatory Visit: Payer: Self-pay | Admitting: Internal Medicine

## 2023-06-10 DIAGNOSIS — Z1231 Encounter for screening mammogram for malignant neoplasm of breast: Secondary | ICD-10-CM

## 2023-07-15 ENCOUNTER — Ambulatory Visit
Admission: RE | Admit: 2023-07-15 | Discharge: 2023-07-15 | Disposition: A | Discharge status: Home / Self Care | Source: Ambulatory Visit | Attending: Internal Medicine | Admitting: Internal Medicine

## 2023-07-15 DIAGNOSIS — Z1231 Encounter for screening mammogram for malignant neoplasm of breast: Secondary | ICD-10-CM

## 2024-02-25 ENCOUNTER — Ambulatory Visit: Admitting: Cardiology

## 2024-03-22 ENCOUNTER — Other Ambulatory Visit: Payer: Self-pay | Admitting: Nephrology

## 2024-03-22 DIAGNOSIS — N1832 Chronic kidney disease, stage 3b: Secondary | ICD-10-CM
